# Patient Record
Sex: Female | Born: 1960 | Race: White | Hispanic: No | Marital: Married | Smoking: Current every day smoker
Health system: Southern US, Community
[De-identification: ages and names within clinical notes are randomized; demographics above are authoritative.]

---

## 1999-11-20 ENCOUNTER — Encounter: Payer: Self-pay | Admitting: Family Medicine

## 1999-11-20 ENCOUNTER — Encounter: Admission: RE | Admit: 1999-11-20 | Discharge: 1999-11-20 | Payer: Self-pay | Admitting: Family Medicine

## 2000-05-16 ENCOUNTER — Other Ambulatory Visit: Admission: RE | Admit: 2000-05-16 | Discharge: 2000-05-16 | Payer: Self-pay | Admitting: Family Medicine

## 2005-09-25 ENCOUNTER — Other Ambulatory Visit: Admission: RE | Admit: 2005-09-25 | Discharge: 2005-09-25 | Payer: Self-pay | Admitting: Obstetrics and Gynecology

## 2010-10-07 ENCOUNTER — Encounter: Payer: Self-pay | Admitting: Family Medicine

## 2013-07-12 ENCOUNTER — Encounter: Payer: Self-pay | Admitting: Gynecology

## 2013-07-12 ENCOUNTER — Ambulatory Visit (INDEPENDENT_AMBULATORY_CARE_PROVIDER_SITE_OTHER): Payer: BC Managed Care – PPO | Admitting: Gynecology

## 2013-07-12 ENCOUNTER — Other Ambulatory Visit (HOSPITAL_COMMUNITY)
Admission: RE | Admit: 2013-07-12 | Discharge: 2013-07-12 | Disposition: A | Discharge status: Home / Self Care | Payer: BC Managed Care – PPO | Source: Ambulatory Visit | Attending: Gynecology | Admitting: Gynecology

## 2013-07-12 VITALS — BP 138/88 | Ht 66.5 in | Wt 163.8 lb

## 2013-07-12 DIAGNOSIS — D259 Leiomyoma of uterus, unspecified: Secondary | ICD-10-CM

## 2013-07-12 DIAGNOSIS — Z23 Encounter for immunization: Secondary | ICD-10-CM

## 2013-07-12 DIAGNOSIS — Z1151 Encounter for screening for human papillomavirus (HPV): Secondary | ICD-10-CM | POA: Insufficient documentation

## 2013-07-12 DIAGNOSIS — F17200 Nicotine dependence, unspecified, uncomplicated: Secondary | ICD-10-CM

## 2013-07-12 DIAGNOSIS — Z01419 Encounter for gynecological examination (general) (routine) without abnormal findings: Secondary | ICD-10-CM

## 2013-07-12 DIAGNOSIS — Z1159 Encounter for screening for other viral diseases: Secondary | ICD-10-CM

## 2013-07-12 DIAGNOSIS — N951 Menopausal and female climacteric states: Secondary | ICD-10-CM

## 2013-07-12 LAB — CBC WITH DIFFERENTIAL/PLATELET
Basophils Absolute: 0 10*3/uL (ref 0.0–0.1)
Basophils Relative: 0 % (ref 0–1)
Eosinophils Absolute: 0 10*3/uL (ref 0.0–0.7)
Eosinophils Relative: 0 % (ref 0–5)
HCT: 44.6 % (ref 36.0–46.0)
Hemoglobin: 15.2 g/dL — ABNORMAL HIGH (ref 12.0–15.0)
Lymphocytes Relative: 24 % (ref 12–46)
Lymphs Abs: 2 10*3/uL (ref 0.7–4.0)
MCH: 29.7 pg (ref 26.0–34.0)
MCHC: 34.1 g/dL (ref 30.0–36.0)
MCV: 87.3 fL (ref 78.0–100.0)
Monocytes Absolute: 0.7 10*3/uL (ref 0.1–1.0)
Monocytes Relative: 9 % (ref 3–12)
Neutro Abs: 5.5 10*3/uL (ref 1.7–7.7)
Neutrophils Relative %: 67 % (ref 43–77)
Platelets: 354 10*3/uL (ref 150–400)
RBC: 5.11 MIL/uL (ref 3.87–5.11)
RDW: 13.5 % (ref 11.5–15.5)
WBC: 8.3 10*3/uL (ref 4.0–10.5)

## 2013-07-12 LAB — URINALYSIS W MICROSCOPIC + REFLEX CULTURE
Bacteria, UA: NONE SEEN
Bilirubin Urine: NEGATIVE
Casts: NONE SEEN
Crystals: NONE SEEN
Glucose, UA: NEGATIVE mg/dL
Hgb urine dipstick: NEGATIVE
Ketones, ur: NEGATIVE mg/dL
Leukocytes, UA: NEGATIVE
Nitrite: NEGATIVE
Protein, ur: NEGATIVE mg/dL
Specific Gravity, Urine: 1.005 (ref 1.005–1.030)
Squamous Epithelial / HPF: NONE SEEN
Urobilinogen, UA: 0.2 mg/dL (ref 0.0–1.0)
pH: 5.5 (ref 5.0–8.0)

## 2013-07-12 LAB — COMPREHENSIVE METABOLIC PANEL
ALT: 20 U/L (ref 0–35)
AST: 19 U/L (ref 0–37)
Albumin: 4.5 g/dL (ref 3.5–5.2)
Alkaline Phosphatase: 46 U/L (ref 39–117)
BUN: 12 mg/dL (ref 6–23)
CO2: 21 mEq/L (ref 19–32)
Calcium: 9.1 mg/dL (ref 8.4–10.5)
Chloride: 107 mEq/L (ref 96–112)
Creat: 0.79 mg/dL (ref 0.50–1.10)
Glucose, Bld: 107 mg/dL — ABNORMAL HIGH (ref 70–99)
Potassium: 4.4 mEq/L (ref 3.5–5.3)
Sodium: 138 mEq/L (ref 135–145)
Total Bilirubin: 0.4 mg/dL (ref 0.3–1.2)
Total Protein: 6.9 g/dL (ref 6.0–8.3)

## 2013-07-12 LAB — TSH: TSH: 0.795 u[IU]/mL (ref 0.350–4.500)

## 2013-07-12 LAB — CHOLESTEROL, TOTAL: Cholesterol: 152 mg/dL (ref 0–200)

## 2013-07-12 LAB — FOLLICLE STIMULATING HORMONE: FSH: 3 m[IU]/mL

## 2013-07-12 LAB — HEPATITIS C ANTIBODY: HCV Ab: NEGATIVE

## 2013-07-12 NOTE — Progress Notes (Signed)
Latoya Ryan 08-07-1961 [id number]   History:    52 y.o.  for annual gyn exam who is new to the practice. Patient stated that she has not had a gynecological exam since 2008. She had mentioned that back in 2006 she had some form of cervical dysplasia and cryotherapy. Patient smokes over a pack of cigarettes per day. Her last mammogram was in 2008 which she states was normal. Patient has not received previous colonoscopy. She reports normal menstrual cycles and is sexually active and not using any form of contraception. She has complained at times of hot flashes at night. She would like to have the flu vaccine today.  Past medical history,surgical history, family history and social history were all reviewed and documented in the EPIC chart.  Gynecologic History Patient's last menstrual period was 06/20/2013. Contraception: none Last Pap: 2008. Results were: normal Last mammogram: 2008. Results were: normal  Obstetric History OB History  No data available     ROS: A ROS was performed and pertinent positives and negatives are included in the history.  GENERAL: No fevers or chills. HEENT: No change in vision, no earache, sore throat or sinus congestion. NECK: No pain or stiffness. CARDIOVASCULAR: No chest pain or pressure. No palpitations. PULMONARY: No shortness of breath, cough or wheeze. GASTROINTESTINAL: No abdominal pain, nausea, vomiting or diarrhea, melena or bright red blood per rectum. GENITOURINARY: No urinary frequency, urgency, hesitancy or dysuria. MUSCULOSKELETAL: No joint or muscle pain, no back pain, no recent trauma. DERMATOLOGIC: No rash, no itching, no lesions. ENDOCRINE: No polyuria, polydipsia, no heat or cold intolerance. No recent change in weight. HEMATOLOGICAL: No anemia or easy bruising or bleeding. NEUROLOGIC: No headache, seizures, numbness, tingling or weakness. PSYCHIATRIC: No depression, no loss of interest in normal activity or change in sleep pattern.      Exam: chaperone present  BP 138/88  Ht 5' 6.5" (1.689 m)  Wt 163 lb 12.8 oz (74.299 kg)  BMI 26.04 kg/m2  LMP 06/20/2013  Body mass index is 26.04 kg/(m^2).  General appearance : Well developed well nourished female. No acute distress HEENT: Neck supple, trachea midline, no carotid bruits, no thyroidmegaly Lungs: Clear to auscultation, no rhonchi or wheezes, or rib retractions  Heart: Regular rate and rhythm, no murmurs or gallops Breast:Examined in sitting and supine position were symmetrical in appearance, no palpable masses or tenderness,  no skin retraction, no nipple inversion, no nipple discharge, no skin discoloration, no axillary or supraclavicular lymphadenopathy Abdomen: no palpable masses or tenderness, no rebound or guarding Extremities: no edema or skin discoloration or tenderness  Pelvic:  Bartholin, Urethra, Skene Glands: Within normal limits             Vagina: No gross lesions or discharge  Cervix: No gross lesions or discharge  Uterus irregularity noted right uterine sidewall fibroid? Adnexa right adnexal fullness? Cyst?  Anus and perineum  normal   Rectovaginal declined             Hemoccult declined rectal exam cords provided     Assessment/Plan:  52 y.o. female for annual exam With no prior gynecological exams as 2008. The patient received the flu vaccine today. She was provided with Hemoccult card system it to the office. She will be referred to a gastroenterologist for a screening colonoscopy. We discussed the detrimental effects of smoking. Patient tried Chantix many years ago cannot tolerate side effects. On exam today fullness was noted on the right adnexa along with a right sidewall uterine fibroid.  Patient will return to the office for followup ultrasound. The following labs were ordered today: CBC, screen cholesterol, comprehensive metabolic panel, urinalysis, TSH, FSH and a Pap smear was done today as well. She was given requisition to schedule a  mammogram as well. We discussed importance of calcium and vitamin D and regular exercise for osteoporosis prevention.  New CDC guidelines is recommending patients be tested once in her lifetime for hepatitis C antibody who were born between [birth year] through 1965. This was discussed with the patient today and has agreed to be tested today.  Literature and information was provided on the following: Flu vaccine, perimenopause, hormone replacement therapy, fibroids and smoking cessation  Note: This dictation was prepared with  Dragon/digital dictation along withSmart phrase technology. Any transcriptional errors that result from this process are unintentional.   Ok Edwards MD, 9:43 AM 07/12/2013

## 2013-07-12 NOTE — Patient Instructions (Addendum)
Hormone Therapy At menopause, your body begins making less estrogen and progesterone hormones. This causes the body to stop having menstrual periods. This is because estrogen and progesterone hormones control your periods and menstrual cycle. A lack of estrogen may cause symptoms such as:  Hot flushes (or hot flashes).  Vaginal dryness.  Dry skin.  Loss of sex drive.  Risk of bone loss (osteoporosis). When this happens, you may choose to take hormone therapy to get back the estrogen lost during menopause. When the hormone estrogen is given alone, it is usually referred to as ET (Estrogen Therapy). When the hormone progestin is combined with estrogen, it is generally called HT (Hormone Therapy). This was formerly known as hormone replacement therapy (HRT). Your caregiver can help you make a decision on what will be best for you. The decision to use HT seems to change often as new studies are done. Many studies do not agree on the benefits of hormone replacement therapy. LIKELY BENEFITS OF HT INCLUDE PROTECTION FROM:  Hot Flushes (also called hot flashes) - A hot flush is a sudden feeling of heat that spreads over the face and body. The skin may redden like a blush. It is connected with sweats and sleep disturbance. Women going through menopause may have hot flushes a few times a month or several times per day depending on the woman.  Osteoporosis (bone loss)- Estrogen helps guard against bone loss. After menopause, a woman's bones slowly lose calcium and become weak and brittle. As a result, bones are more likely to break. The hip, wrist, and spine are affected most often. Hormone therapy can help slow bone loss after menopause. Weight bearing exercise and taking calcium with vitamin D also can help prevent bone loss. There are also medications that your caregiver can prescribe that can help prevent osteoporosis.  Vaginal Dryness - Loss of estrogen causes changes in the vagina. Its lining may  become thin and dry. These changes can cause pain and bleeding during sexual intercourse. Dryness can also lead to infections. This can cause burning and itching. (Vaginal estrogen treatment can help relieve pain, itching, and dryness.)  Urinary Tract Infections are more common after menopause because of lack of estrogen. Some women also develop urinary incontinence because of low estrogen levels in the vagina and bladder.  Possible other benefits of estrogen include a positive effect on mood and short-term memory in women. RISKS AND COMPLICATIONS  Using estrogen alone without progesterone causes the lining of the uterus to grow. This increases the risk of lining of the uterus (endometrial) cancer. Your caregiver should give another hormone called progestin if you have a uterus.  Women who take combined (estrogen and progestin) HT appear to have an increased risk of breast cancer. The risk appears to be small, but increases throughout the time that HT is taken.  Combined therapy also makes the breast tissue slightly denser which makes it harder to read mammograms (breast X-rays).  Combined, estrogen and progesterone therapy can be taken together every day, in which case there may be spotting of blood. HT therapy can be taken cyclically in which case you will have menstrual periods. Cyclically means HT is taken for a set amount of days, then not taken, then this process is repeated.  HT may increase the risk of stroke, heart attack, breast cancer and forming blood clots in your leg.  Transdermal estrogen (estrogen that is absorbed through the skin with a patch or a cream) may have more positive results with:    Cholesterol.  Blood pressure.  Blood clots. Having the following conditions may indicate you should not have HT:  Endometrial cancer.  Liver disease.  Breast cancer.  Heart disease.  History of blood clots.  Stroke. TREATMENT   If you choose to take HT and have a uterus,  usually estrogen and progestin are prescribed.  Your caregiver will help you decide the best way to take the medications.  Possible ways to take estrogen include:  Pills.  Patches.  Gels.  Sprays.  Vaginal estrogen cream, rings and tablets.  It is best to take the lowest dose possible that will help your symptoms and take them for the shortest period of time that you can.  Hormone therapy can help relieve some of the problems (symptoms) that affect women at menopause. Before making a decision about HT, talk to your caregiver about what is best for you. Be well informed and comfortable with your decisions. HOME CARE INSTRUCTIONS   Follow your caregivers advice when taking the medications.  A Pap test is done to screen for cervical cancer.  The first Pap test should be done at age 74.  Between ages 39 and 31, Pap tests are repeated every 2 years.  Beginning at age 39, you are advised to have a Pap test every 3 years as long as your past 3 Pap tests have been normal.  Some women have medical problems that increase the chance of getting cervical cancer. Talk to your caregiver about these problems. It is especially important to talk to your caregiver if a new problem develops soon after your last Pap test. In these cases, your caregiver may recommend more frequent screening and Pap tests.  The above recommendations are the same for women who have or have not gotten the vaccine for HPV (Human Papillomavirus).  If you had a hysterectomy for a problem that was not a cancer or a condition that could lead to cancer, then you no longer need Pap tests. However, even if you no longer need a Pap test, a regular exam is a good idea to make sure no other problems are starting.   If you are between ages 13 and 31, and you have had normal Pap tests going back 10 years, you no longer need Pap tests. However, even if you no longer need a Pap test, a regular exam is a good idea to make sure no  other problems are starting.   If you have had past treatment for cervical cancer or a condition that could lead to cancer, you need Pap tests and screening for cancer for at least 20 years after your treatment.  If Pap tests have been discontinued, risk factors (such as a new sexual partner) need to be re-assessed to determine if screening should be resumed.  Some women may need screenings more often if they are at high risk for cervical cancer.  Get mammograms done as per the advice of your caregiver. SEEK IMMEDIATE MEDICAL CARE IF:  You develop abnormal vaginal bleeding.  You have pain or swelling in your legs, shortness of breath, or chest pain.  You develop dizziness or headaches.  You have lumps or changes in your breasts or armpits.  You have slurred speech.  You develop weakness or numbness of your arms or legs.  You have pain, burning, or bleeding when urinating.  You develop abdominal pain. Document Released: 06/01/2003 Document Revised: 11/25/2011 Document Reviewed: 09/19/2010 [facility] Patient Information 2014 [place], [place]. Perimenopause Perimenopause is the time when  your body begins to move into the menopause (no menstrual period for 12 straight months). It is a natural process. Perimenopause can begin 2 to 8 years before the menopause and usually lasts for one year after the menopause. During this time, your ovaries may or may not produce an egg. The ovaries vary in their production of estrogen and progesterone hormones each month. This can cause irregular menstrual periods, difficulty in getting pregnant, vaginal bleeding between periods and uncomfortable symptoms. CAUSES  Irregular production of the ovarian hormones, estrogen and progesterone, and not ovulating every month.  Other causes include:  Tumor of the pituitary gland in the brain.  Medical disease that affects the ovaries.  Radiation treatment.  Chemotherapy.  Unknown causes.  Heavy  smoking and excessive alcohol intake can bring on perimenopause sooner. SYMPTOMS   Hot flashes.  Night sweats.  Irregular menstrual periods.  Decrease sex drive.  Vaginal dryness.  Headaches.  Mood swings.  Depression.  Memory problems.  Irritability.  Tiredness.  Weight gain.  Trouble getting pregnant.  The beginning of losing bone cells (osteoporosis).  The beginning of hardening of the arteries (atherosclerosis). DIAGNOSIS  Your caregiver will make a diagnosis by analyzing your age, menstrual history and your symptoms. They will do a physical exam noting any changes in your body, especially your female organs. Female hormone tests may or may not be helpful depending on the amount and when you produce the female hormones. However, other hormone tests may be helpful (ex. thyroid hormone) to rule out other problems. TREATMENT  The decision to treat during the perimenopause should be made by you and your caregiver depending on how the symptoms are affecting you and your life style. There are various treatments available such as:  Treating individual symptoms with a specific medication for that symptom (ex. tranquilizer for depression).  Herbal medications that can help specific symptoms.  Counseling.  Group therapy.  No treatment. HOME CARE INSTRUCTIONS   Before seeing your caregiver, make a list of your menstrual periods (when the occur, how heavy they are, how long between periods and how long they last), your symptoms and when they started.  Take the medication as recommended by your caregiver.  Sleep and rest.  Exercise.  Eat a diet that contains calcium (good for your bones) and soy (acts like estrogen hormone).  Do not smoke.  Avoid alcoholic beverages.  Taking vitamin E may help in certain cases.  Take calcium and vitamin D supplements to help prevent bone loss.  Group therapy is sometimes helpful.  Acupuncture may help in some cases. SEEK  MEDICAL CARE IF:   You have any of the above and want to know if it is perimenopause.  You want advice and treatment for any of your symptoms mentioned above.  You need a referral to a specialist (gynecologist, psychiatrist or psychologist). SEEK IMMEDIATE MEDICAL CARE IF:   You have vaginal bleeding.  Your period lasts longer than 8 days.  You periods are recurring sooner than 21 days.  You have bleeding after intercourse.  You have severe depression.  You have pain when you urinate.  You have severe headaches.  You develop vision problems. Document Released: 10/10/2004 Document Revised: 11/25/2011 Document Reviewed: 06/30/2008 [facility] Patient Information 2014 [place], [place]. Tetanus, Diphtheria, Pertussis (Tdap) Vaccine What You Need to Know WHY GET VACCINATED? Tetanus, diphtheria and pertussis can be very serious diseases, even for adolescents and adults. Tdap vaccine can protect [place] from these diseases. TETANUS (Lockjaw) causes painful muscle tightening  and stiffness, usually all over the body.  It can lead to tightening of muscles in the head and neck so you can't open your mouth, swallow, or sometimes even breathe. Tetanus kills about 1 out of 5 people who are infected. DIPHTHERIA can cause a thick coating to form in the back of the throat.  It can lead to breathing problems, paralysis, heart failure, and death. PERTUSSIS (Whooping Cough) causes severe coughing spells, which can cause difficulty breathing, vomiting and disturbed sleep.  It can also lead to weight loss, incontinence, and rib fractures. Up to 2 in 100 adolescents and 5 in 100 adults with pertussis are hospitalized or have complications, which could include pneumonia and death. These diseases are caused by bacteria. Diphtheria and pertussis are spread from person to person through coughing or sneezing. Tetanus enters the body through cuts, scratches, or wounds. Before vaccines, the [place] saw as  many as 200,000 cases a year of diphtheria and pertussis, and hundreds of cases of tetanus. Since vaccination began, tetanus and diphtheria have dropped by about 99% and pertussis by about 80%. TDAP VACCINE Tdap vaccine can protect adolescents and adults from tetanus, diphtheria, and pertussis. One dose of Tdap is routinely given at age 25 or 31. People who did not get Tdap at that age should get it as soon as possible. Tdap is especially important for health care professionals and anyone having close contact with a baby younger than 12 months. Pregnant women should get a dose of Tdap during every pregnancy, to protect the newborn from pertussis. Infants are most at risk for severe, life-threatening complications from pertussis. A similar vaccine, called Td, protects from tetanus and diphtheria, but not pertussis. A Td booster should be given every 10 years. Tdap may be given as one of these boosters if you have not already gotten a dose. Tdap may also be given after a severe cut or burn to prevent tetanus infection. Your doctor can give you more information. Tdap may safely be given at the same time as other vaccines. SOME PEOPLE SHOULD NOT GET THIS VACCINE  If you ever had a life-threatening allergic reaction after a dose of any tetanus, diphtheria, or pertussis containing vaccine, OR if you have a severe allergy to any part of this vaccine, you should not get Tdap. Tell your doctor if you have any severe allergies.  If you had a coma, or long or multiple seizures within 7 days after a childhood dose of DTP or DTaP, you should not get Tdap, unless a cause other than the vaccine was found. You can still get Td.  Talk to your doctor if you:  have epilepsy or another nervous system problem,  had severe pain or swelling after any vaccine containing diphtheria, tetanus or pertussis,  ever had Guillain-Barr Syndrome (GBS),  aren't feeling well on the day the shot is scheduled. RISKS OF A VACCINE  REACTION With any medicine, including vaccines, there is a chance of side effects. These are usually mild and go away on their own, but serious reactions are also possible. Brief fainting spells can follow a vaccination, leading to injuries from falling. Sitting or lying down for about 15 minutes can help prevent these. Tell your doctor if you feel dizzy or light-headed, or have vision changes or ringing in the ears. Mild problems following Tdap (Did not interfere with activities)  Pain where the shot was given (about 3 in 4 adolescents or 2 in 3 adults)  Redness or swelling where the  shot was given (about 1 person in 5)  Mild fever of at least 100.31F (up to about 1 in 25 adolescents or 1 in 100 adults)  Headache (about 3 or 4 people in 10)  Tiredness (about 1 person in 3 or 4)  Nausea, vomiting, diarrhea, stomach ache (up to 1 in 4 adolescents or 1 in 10 adults)  Chills, body aches, sore joints, rash, swollen glands (uncommon) Moderate problems following Tdap (Interfered with activities, but did not require medical attention)  Pain where the shot was given (about 1 in 5 adolescents or 1 in 100 adults)  Redness or swelling where the shot was given (up to about 1 in 16 adolescents or 1 in 25 adults)  Fever over 102F (about 1 in 100 adolescents or 1 in 250 adults)  Headache (about 3 in 20 adolescents or 1 in 10 adults)  Nausea, vomiting, diarrhea, stomach ache (up to 1 or 3 people in 100)  Swelling of the entire arm where the shot was given (up to about 3 in 100). Severe problems following Tdap (Unable to perform usual activities, required medical attention)  Swelling, severe pain, bleeding and redness in the arm where the shot was given (rare). A severe allergic reaction could occur after any vaccine (estimated less than 1 in a million doses). WHAT IF THERE IS A SERIOUS REACTION? What should I look for?  Look for anything that concerns you, such as signs of a severe allergic  reaction, very high fever, or behavior changes. Signs of a severe allergic reaction can include hives, swelling of the face and throat, difficulty breathing, a fast heartbeat, dizziness, and weakness. These would start a few minutes to a few hours after the vaccination. What should I do?  If you think it is a severe allergic reaction or other emergency that can't wait, call 9-1-1 or get the person to the nearest hospital. Otherwise, call your doctor.  Afterward, the reaction should be reported to the "Vaccine Adverse Event Reporting System" (VAERS). Your doctor might file this report, or you can do it yourself through the VAERS web site at www.vaers.LAgents.no, or by calling 1-270 861 1211. VAERS is only for reporting reactions. They do not give medical advice.  THE NATIONAL VACCINE INJURY COMPENSATION PROGRAM The National Vaccine Injury Compensation Program (VICP) is a federal program that was created to compensate people who may have been injured by certain vaccines. Persons who believe they may have been injured by a vaccine can learn about the program and about filing a claim by calling 1-629 445 3804 or visiting the VICP website at SpiritualWord.at. HOW CAN I LEARN MORE?  Ask your doctor.  Call your local or state health department.  Contact the Centers for Disease Control and Prevention (CDC):  Call 815-416-2081 or visit CDC's website at PicCapture.uy. CDC Tdap Vaccine VIS (01/23/12) Document Released: 03/03/2012 Document Revised: 05/27/2012 Document Reviewed: 03/03/2012 [employer] Patient Information 2014 [place], [place]. Influenza Vaccine (Flu Vaccine, Inactivated) 2013 2014 What You Need to Know WHY GET VACCINATED?  Influenza ("flu") is a contagious disease that spreads around the [place] every winter, usually between October and May.  Flu is caused by the influenza virus, and can be spread by coughing, sneezing, and close contact.  Anyone can get flu,  but the risk of getting flu is highest among children. Symptoms come on suddenly and may last several days. They can include:  Fever or chills.  Sore throat.  Muscle aches.  Fatigue.  Cough.  Headache.  Runny or stuffy nose.  Flu can make some people much sicker than others. These people include young children, people 33 and older, pregnant women, and people with certain health conditions such as heart, lung or kidney disease, or a weakened immune system. Flu vaccine is especially important for these people, and anyone in close contact with them. Flu can also lead to pneumonia, and make existing medical conditions worse. It can cause diarrhea and seizures in children. Each year thousands of people in the [place] die from flu, and many more are hospitalized. Flu vaccine is the best protection we have from flu and its complications. Flu vaccine also helps prevent spreading flu from person to person. INACTIVATED FLU VACCINE There are 2 types of influenza vaccine:  You are getting an inactivated flu vaccine, which does not contain any live influenza virus. It is given by injection with a needle, and often called the "flu shot."  A different live, attenuated (weakened) influenza vaccine is sprayed into the nostrils. This vaccine is described in a separate Vaccine Information Statement. Flu vaccine is recommended every year. Children 6 months through 100 years of age should get 2 doses the first year they get vaccinated. Flu viruses are always changing. Each year's flu vaccine is made to protect from viruses that are most likely to cause disease that year. While flu vaccine cannot prevent all cases of flu, it is our best defense against the disease. Inactivated flu vaccine protects against 3 or 4 different influenza viruses. It takes about 2 weeks for protection to develop after the vaccination, and protection lasts several months to a year. Some illnesses that are not caused by influenza  virus are often mistaken for flu. Flu vaccine will not prevent these illnesses. It can only prevent influenza. A "high-dose" flu vaccine is available for people 39 years of age and older. The person giving you the vaccine can tell you more about it. Some inactivated flu vaccine contains a very small amount of a mercury-based preservative called thimerosal. Studies have shown that thimerosal in vaccines is not harmful, but flu vaccines that do not contain a preservative are available. SOME PEOPLE SHOULD NOT GET THIS VACCINE Tell the person who gives you the vaccine:  If you have any severe (life-threatening) allergies. If you ever had a life-threatening allergic reaction after a dose of flu vaccine, or have a severe allergy to any part of this vaccine, you may be advised not to get a dose. Most, but not all, types of flu vaccine contain a small amount of egg.  If you ever had Guillain Barr Syndrome (a severe paralyzing illness, also called GBS). Some people with a history of GBS should not get this vaccine. This should be discussed with your doctor.  If you are not feeling well. They might suggest waiting until you feel better. But you should come back. RISKS OF A VACCINE REACTION With a vaccine, like any medicine, there is a chance of side effects. These are usually mild and go away on their own. Serious side effects are also possible, but are very rare. Inactivated flu vaccine does not contain live flu virus, sogetting flu from this vaccine is not possible. Brief fainting spells and related symptoms (such as jerking movements) can happen after any medical procedure, including vaccination. Sitting or lying down for about 15 minutes after a vaccination can help prevent fainting and injuries caused by falls. Tell your doctor if you feel dizzy or lightheaded, or have vision changes or ringing in the ears. Mild problems  following inactivated flu vaccine:  Soreness, redness, or swelling where the shot  was given.  Hoarseness; sore, red or itchy eyes; or cough.  Fever.  Aches.  Headache.  Itching.  Fatigue. If these problems occur, they usually begin soon after the shot and last 1 or 2 days. Moderate problems following inactivated flu vaccine:  Young children who get inactivated flu vaccine and pneumococcal vaccine (PCV13) at the same time may be at increased risk for seizures caused by fever. Ask your doctor for more information. Tell your doctor if a child who is getting flu vaccine has ever had a seizure. Severe problems following inactivated flu vaccine:  A severe allergic reaction could occur after any vaccine (estimated less than 1 in a million doses).  There is a small possibility that inactivated flu vaccine could be associated with Guillan Barr Syndrome (GBS), no more than 1 or 2 cases per million people vaccinated. This is much lower than the risk of severe complications from flu, which can be prevented by flu vaccine. The safety of vaccines is always being monitored. For more information, visit: http://floyd.org/ WHAT IF THERE IS A SERIOUS REACTION? What should I look for?  Look for anything that concerns you, such as signs of a severe allergic reaction, very high fever, or behavior changes. Signs of a severe allergic reaction can include hives, swelling of the face and throat, difficulty breathing, a fast heartbeat, dizziness, and weakness. These would start a few minutes to a few hours after the vaccination. What should I do?  If you think it is a severe allergic reaction or other emergency that cannot wait, call [phone] 1 1  or get the person to the nearest hospital. Otherwise, call your doctor.  Afterward, the reaction should be reported to the Vaccine Adverse Event Reporting System (VAERS). Your doctor might file this report, or you can do it yourself through the VAERS website at www.vaers.LAgents.no, or by calling 1-631-273-8623. VAERS is only for reporting  reactions. They do not give medical advice. THE NATIONAL VACCINE INJURY COMPENSATION PROGRAM The National Vaccine Injury Compensation Program (VICP) is a federal program that was created to compensate people who may have been injured by certain vaccines. Persons who believe they may have been injured by a vaccine can learn about the program and about filing a claim by calling 1-318 832 6659 or visiting the VICP website at SpiritualWord.at HOW CAN I LEARN MORE?  Ask your doctor.  Call your local or state health department.  Contact the Centers for Disease Control and Prevention (CDC):  Call 912-192-1836 (1-800-CDC-INFO) or  Visit CDC's website at BiotechRoom.com.cy CDC Inactivated Influenza Vaccine Interim VIS (04/10/12) Document Released: 06/27/2006 Document Revised: 05/27/2012 Document Reviewed: 04/10/2012 [facility] Patient Information 2014 [place], [place]. Smoking Cessation Quitting smoking is important to your health and has many advantages. However, it is not always easy to quit since nicotine is a very addictive drug. Often times, people try 3 times or more before being able to quit. This document explains the best ways for you to prepare to quit smoking. Quitting takes hard work and a lot of effort, but you can do it. ADVANTAGES OF QUITTING SMOKING  You will live longer, feel better, and live better.  Your body will feel the impact of quitting smoking almost immediately.  Within 20 minutes, blood pressure decreases. Your pulse returns to its normal level.  After 8 hours, carbon monoxide levels in the blood return to normal. Your oxygen level increases.  After 24 hours, the chance of having  a heart attack starts to decrease. Your breath, hair, and body stop smelling like smoke.  After 48 hours, damaged nerve endings begin to recover. Your sense of taste and smell improve.  After 72 hours, the body is virtually free of nicotine. Your bronchial tubes relax and  breathing becomes easier.  After 2 to 12 weeks, lungs can hold more air. Exercise becomes easier and circulation improves.  The risk of having a heart attack, stroke, cancer, or lung disease is greatly reduced.  After 1 year, the risk of coronary heart disease is cut in half.  After 5 years, the risk of stroke falls to the same as a nonsmoker.  After 10 years, the risk of lung cancer is cut in half and the risk of other cancers decreases significantly.  After 15 years, the risk of coronary heart disease drops, usually to the level of a nonsmoker.  If you are pregnant, quitting smoking will improve your chances of having a healthy baby.  The people you live with, especially any children, will be healthier.  You will have extra money to spend on things other than cigarettes. QUESTIONS TO THINK ABOUT BEFORE ATTEMPTING TO QUIT You may want to talk about your answers with your caregiver.  Why do you want to quit?  If you tried to quit in the past, what helped and what did not?  What will be the most difficult situations for you after you quit? How will you plan to handle them?  Who can help you through the tough times? Your family? Friends? A caregiver?  What pleasures do you get from smoking? What ways can you still get pleasure if you quit? Here are some questions to ask your caregiver:  How can you help me to be successful at quitting?  What medicine do you think would be best for me and how should I take it?  What should I do if I need more help?  What is smoking withdrawal like? How can I get information on withdrawal? GET READY  Set a quit date.  Change your environment by getting rid of all cigarettes, ashtrays, matches, and lighters in your home, car, or work. Do not let people smoke in your home.  Review your past attempts to quit. Think about what worked and what did not. GET SUPPORT AND ENCOURAGEMENT You have a better chance of being successful if you have help.  You can get support in many ways.  Tell your family, friends, and co-workers that you are going to quit and need their support. Ask them not to smoke around you.  Get individual, group, or telephone counseling and support. Programs are available at [employer] and health centers. Call your local health department for information about programs in your area.  Spiritual beliefs and practices may help some smokers quit.  Download a "quit meter" on your computer to keep track of quit statistics, such as how long you have gone without smoking, cigarettes not smoked, and money saved.  Get a self-help book about quitting smoking and staying off of tobacco. LEARN NEW SKILLS AND BEHAVIORS  Distract yourself from urges to smoke. Talk to someone, go for a walk, or occupy your time with a task.  Change your normal routine. Take a different route to work. Drink tea instead of coffee. Eat breakfast in a different place.  Reduce your stress. Take a hot bath, exercise, or read a book.  Plan something enjoyable to do every day. Reward yourself for not smoking.  Explore interactive web-based programs that specialize in helping you quit. GET MEDICINE AND USE IT CORRECTLY Medicines can help you stop smoking and decrease the urge to smoke. Combining medicine with the above behavioral methods and support can greatly increase your chances of successfully quitting smoking.  Nicotine replacement therapy helps deliver nicotine to your body without the negative effects and risks of smoking. Nicotine replacement therapy includes nicotine gum, lozenges, inhalers, nasal sprays, and skin patches. Some may be available over-the-counter and others require a prescription.  Antidepressant medicine helps people abstain from smoking, but how this works is unknown. This medicine is available by prescription.  Nicotinic receptor partial agonist medicine simulates the effect of nicotine in your brain. This medicine is  available by prescription. Ask your caregiver for advice about which medicines to use and how to use them based on your health history. Your caregiver will tell you what side effects to look out for if you choose to be on a medicine or therapy. Carefully read the information on the package. Do not use any other product containing nicotine while using a nicotine replacement product.  RELAPSE OR DIFFICULT SITUATIONS Most relapses occur within the first 3 months after quitting. Do not be discouraged if you start smoking again. Remember, most people try several times before finally quitting. You may have symptoms of withdrawal because your body is used to nicotine. You may crave cigarettes, be irritable, feel very hungry, cough often, get headaches, or have difficulty concentrating. The withdrawal symptoms are only temporary. They are strongest when you first quit, but they will go away within 10 14 days. To reduce the chances of relapse, try to:  Avoid drinking alcohol. Drinking lowers your chances of successfully quitting.  Reduce the amount of caffeine you consume. Once you quit smoking, the amount of caffeine in your body increases and can give you symptoms, such as a rapid heartbeat, sweating, and anxiety.  Avoid smokers because they can make you want to smoke.  Do not let weight gain distract you. Many smokers will gain weight when they quit, usually less than 10 pounds. Eat a healthy diet and stay active. You can always lose the weight gained after you quit.  Find ways to improve your mood other than smoking. FOR MORE INFORMATION  www.smokefree.gov  Document Released: 08/27/2001 Document Revised: 03/03/2012 Document Reviewed: 12/12/2011 [facility] Patient Information 2014 [place], [place]. Fibroids Fibroids are lumps (tumors) that can occur any place in a woman's body. These lumps are not cancerous. Fibroids vary in size, weight, and where they grow. HOME CARE  Do not take aspirin.  Write  down the number of pads or tampons you use during your period. Tell your doctor. This can help determine the best treatment for you. GET HELP RIGHT AWAY IF:  You have pain in your lower belly (abdomen) that is not helped with medicine.  You have cramps that are not helped with medicine.  You have more bleeding between or during your period.  You feel lightheaded or pass out (faint).  Your lower belly pain gets worse. MAKE SURE YOU:  Understand these instructions.  Will watch your condition.  Will get help right away if you are not doing well or get worse. Document Released: 10/05/2010 Document Revised: 11/25/2011 Document Reviewed: 10/05/2010 [facility] Patient Information 2014 [place], [place].

## 2013-07-28 ENCOUNTER — Other Ambulatory Visit: Payer: BC Managed Care – PPO

## 2013-07-28 ENCOUNTER — Ambulatory Visit: Payer: BC Managed Care – PPO | Admitting: Gynecology

## 2013-08-06 ENCOUNTER — Ambulatory Visit (INDEPENDENT_AMBULATORY_CARE_PROVIDER_SITE_OTHER): Payer: BC Managed Care – PPO

## 2013-08-06 ENCOUNTER — Ambulatory Visit (INDEPENDENT_AMBULATORY_CARE_PROVIDER_SITE_OTHER): Payer: BC Managed Care – PPO | Admitting: Gynecology

## 2013-08-06 DIAGNOSIS — D259 Leiomyoma of uterus, unspecified: Secondary | ICD-10-CM

## 2013-08-06 DIAGNOSIS — R1900 Intra-abdominal and pelvic swelling, mass and lump, unspecified site: Secondary | ICD-10-CM

## 2013-08-06 MED ORDER — MEDROXYPROGESTERONE ACETATE 150 MG/ML IM SUSP
150.0000 mg | Freq: Once | INTRAMUSCULAR | Status: AC
  Administered 2013-08-06: 150 mg via INTRAMUSCULAR

## 2013-08-06 NOTE — Progress Notes (Signed)
Patient presented to the office today to discuss her ultrasound. The scan was ordered as a result of findings during pelvic exam on her annual visit on 07/12/2013 which she was seen in the office as a new patient. Patient stated that she has not had a gynecological exam since 2008. She had mentioned that back in 2006 she had some form of cervical dysplasia and cryotherapy. Patient smokes over a pack of cigarettes per day.  During her office visit 07/12/2013 her uterus felt irregular especially the right uterine fundal sidewall. Ultrasound today demonstrated the following:  Uterus measured 9.4 x 7.0 x 4.6 cm with endometrial stripe of 8.3 mm. Patient had 5 fibroids the largest one measuring 37 x 35 mm. On the right ovary and echo-free cysts thickwalled vascular measuring 23 x 18 x 17 mm consistent with corpus luteum cyst. The right adnexal echo-free thinwall avascular seeded mass was noted measuring 34 x 22 mm. 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  Assessment/plan: Perimenopausal patient at 52 years of age with incidental finding on long-overdue gynecological exam consisting of a large left ovarian mass 6 cm in diameter. We had a discussion on the different types of cyst as well as consistency and those are suspicious for malignancy. She also had the right ovary echo free thick  walled vascular cyst measuring 23 x 18 x 17 mm as well as the additional one echo free thin-walled avascular which measured 34 x 22 mm which could be functional.She is going to receive a shot of Depo-Provera 150 mg IM today  and I explained her the limitations on whether this may help shrink the cysts or not. We're also going to check an Ova-1 ovarian cancer blood screen and manage accordingly. If this does come back normal  We are going to repeat the ultrasound in 2 months but if it's elevated and suspicious for malignancy we'll need to discuss alternative options such as possible GYN  oncology referral for possible staging the event of cancer. Literature information was provided all questions were answered.

## 2013-08-06 NOTE — Patient Instructions (Signed)
Ovarian Cyst  The ovaries are small organs that are on each side of the uterus. The ovaries are the organs that produce the female hormones, estrogen and progesterone. An ovarian cyst is a sac filled with fluid that can vary in its size. It is normal for a small cyst to form in women who are in the childbearing age and who have menstrual periods. This type of cyst is called a follicle cyst that becomes an ovulation cyst (corpus luteum cyst) after it produces the women's egg. It later goes away on its own if the woman does not become pregnant. There are other kinds of ovarian cysts that may cause problems and may need to be treated. The most serious problem is a cyst with cancer. It should be noted that menopausal women who have an ovarian cyst are at a higher risk of it being a cancer cyst. They should be evaluated very quickly, thoroughly and followed closely. This is especially true in menopausal women because of the high rate of ovarian cancer in women in menopause.  CAUSES AND TYPES OF OVARIAN CYSTS:   FUNCTIONAL CYST: The follicle/corpus luteum cyst is a functional cyst that occurs every month during ovulation with the menstrual cycle. They go away with the next menstrual cycle if the woman does not get pregnant. Usually, there are no symptoms with a functional cyst.   ENDOMETRIOMA CYST: This cyst develops from the lining of the uterus tissue. This cyst gets in or on the ovary. It grows every month from the bleeding during the menstrual period. It is also called a "chocolate cyst" because it becomes filled with blood that turns brown. This cyst can cause pain in the lower abdomen during intercourse and with your menstrual period.   CYSTADENOMA CYST: This cyst develops from the cells on the outside of the ovary. They usually are not cancerous. They can get very big and cause lower abdomen pain and pain with intercourse. This type of cyst can twist on itself, cut off its blood supply and cause severe pain. It  also can easily rupture and cause a lot of pain.   DERMOID CYST: This type of cyst is sometimes found in both ovaries. They are found to have different kinds of body tissue in the cyst. The tissue includes skin, teeth, hair, and/or cartilage. They usually do not have symptoms unless they get very big. Dermoid cysts are rarely cancerous.   POLYCYSTIC OVARY: This is a rare condition with hormone problems that produces many small cysts on both ovaries. The cysts are follicle-like cysts that never produce an egg and become a corpus luteum. It can cause an increase in body weight, infertility, acne, increase in body and facial hair and lack of menstrual periods or rare menstrual periods. Many women with this problem develop type 2 diabetes. The exact cause of this problem is unknown. A polycystic ovary is rarely cancerous.   THECA LUTEIN CYST: Occurs when too much hormone (human chorionic gonadotropin) is produced and over-stimulates the ovaries to produce an egg. They are frequently seen when doctors stimulate the ovaries for invitro-fertilization (test tube babies).   LUTEOMA CYST: This cyst is seen during pregnancy. Rarely it can cause an obstruction to the birth canal during labor and delivery. They usually go away after delivery.  SYMPTOMS    Pelvic pain or pressure.   Pain during sexual intercourse.   Increasing girth (swelling) of the abdomen.   Abnormal menstrual periods.   Increasing pain with menstrual periods.     You stop having menstrual periods and you are not pregnant.  DIAGNOSIS   The diagnosis can be made during:   Routine or annual pelvic examination (common).   Ultrasound.   X-ray of the pelvis.   CT Scan.   MRI.   Blood tests.  TREATMENT    Treatment may only be to follow the cyst monthly for 2 to 3 months with your caregiver. Many go away on their own, especially functional cysts.   May be aspirated (drained) with a long needle with ultrasound, or by laparoscopy (inserting a tube into  the pelvis through a small incision).   The whole cyst can be removed by laparoscopy.   Sometimes the cyst may need to be removed through an incision in the lower abdomen.   Hormone treatment is sometimes used to help dissolve certain cysts.   Birth control pills are sometimes used to help dissolve certain cysts.  HOME CARE INSTRUCTIONS   Follow your caregiver's advice regarding:   Medicine.   Follow up visits to evaluate and treat the cyst.   You may need to come back or make an appointment with another caregiver, to find the exact cause of your cyst, if your caregiver is not a gynecologist.   Get your yearly and recommended pelvic examinations and Pap tests.   Let your caregiver know if you have had an ovarian cyst in the past.  SEEK MEDICAL CARE IF:    Your periods are late, irregular, they stop, or are painful.   Your stomach (abdomen) or pelvic pain does not go away.   Your stomach becomes larger or swollen.   You have pressure on your bladder or trouble emptying your bladder completely.   You have painful sexual intercourse.   You have feelings of fullness, pressure, or discomfort in your stomach.   You lose weight for no apparent reason.   You feel generally ill.   You become constipated.   You lose your appetite.   You develop acne.   You have an increase in body and facial hair.   You are gaining weight, without changing your exercise and eating habits.   You think you are pregnant.  SEEK IMMEDIATE MEDICAL CARE IF:    You have increasing abdominal pain.   You feel sick to your stomach (nausea) and/or vomit.   You develop a fever that comes on suddenly.   You develop abdominal pain during a bowel movement.   Your menstrual periods become heavier than usual.  Document Released: 09/02/2005 Document Revised: 11/25/2011 Document Reviewed: 07/06/2009  [employer] Patient Information 2014 [employer].

## 2013-10-01 ENCOUNTER — Ambulatory Visit: Payer: BC Managed Care – PPO | Admitting: Gynecology

## 2013-10-01 ENCOUNTER — Other Ambulatory Visit: Payer: BC Managed Care – PPO

## 2013-10-04 ENCOUNTER — Other Ambulatory Visit: Payer: Self-pay | Admitting: Gynecology

## 2013-10-04 ENCOUNTER — Ambulatory Visit (INDEPENDENT_AMBULATORY_CARE_PROVIDER_SITE_OTHER): Payer: BC Managed Care – PPO

## 2013-10-04 ENCOUNTER — Ambulatory Visit (INDEPENDENT_AMBULATORY_CARE_PROVIDER_SITE_OTHER): Payer: BC Managed Care – PPO | Admitting: Gynecology

## 2013-10-04 VITALS — BP 144/80

## 2013-10-04 DIAGNOSIS — N831 Corpus luteum cyst of ovary, unspecified side: Secondary | ICD-10-CM

## 2013-10-04 DIAGNOSIS — R9389 Abnormal findings on diagnostic imaging of other specified body structures: Secondary | ICD-10-CM

## 2013-10-04 DIAGNOSIS — D259 Leiomyoma of uterus, unspecified: Secondary | ICD-10-CM

## 2013-10-04 DIAGNOSIS — D251 Intramural leiomyoma of uterus: Secondary | ICD-10-CM

## 2013-10-04 DIAGNOSIS — R1900 Intra-abdominal and pelvic swelling, mass and lump, unspecified site: Secondary | ICD-10-CM

## 2013-10-04 DIAGNOSIS — N951 Menopausal and female climacteric states: Secondary | ICD-10-CM

## 2013-10-04 DIAGNOSIS — D252 Subserosal leiomyoma of uterus: Secondary | ICD-10-CM

## 2013-10-04 DIAGNOSIS — N839 Noninflammatory disorder of ovary, fallopian tube and broad ligament, unspecified: Secondary | ICD-10-CM

## 2013-10-04 DIAGNOSIS — N9489 Other specified conditions associated with female genital organs and menstrual cycle: Secondary | ICD-10-CM

## 2013-10-04 DIAGNOSIS — N838 Other noninflammatory disorders of ovary, fallopian tube and broad ligament: Secondary | ICD-10-CM

## 2013-10-04 NOTE — Progress Notes (Signed)
   Patient is a 53 year old gravida 0 who presented to be office today for followup ultrasound on previously seen ovarian cyst. She was last seen in the office 08/06/2013 for the first ultrasound as a result of incidental finding during routine gynecological examination on 07/12/2013. Patient and not seen a gynecologist in over 7 years.She had mentioned that back in 2006 she had some form of cervical dysplasia and cryotherapy. Patient smokes over a pack of cigarettes per day.  The ultrasound 08/06/2013 as follows: Uterus measured 9.4 x 7.0 x 4.6 cm with endometrial stripe of 8.3 mm. Patient had 5 fibroids the largest one measuring 37 x 35 mm. On the right ovary and echo-free cysts thickwalled vascular measuring 23 x 18 x 17 mm consistent with corpus luteum cyst. The right adnexal echo-free thinwall avascular seeded mass was noted measuring 34 x 22 mm. 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  An Ova1 ovarian cancer blood screen test was ordered but it appears to not have been run by the lab?  She was asked to return for the followup ultrasound today to see this any significant change after having received Depo-Provera 150 mg IM.  Ultrasound today: Uterus unchanged. Multiple fibroids are seen before. Prominent endometrial cavity with positive vascular flow. Right ovary normal with follicles, serpentine tubular mass, with internal low level echoes avascular measuring 16.8 x 5.2 x 5.5 cm, left ovary thinwall avascular cyst measuring 8.8 x 1.0 x 6.9 cm diffuse homogeneous low level internal low level echoes larger than previous scan.  The patient is still menstruating and has very little if any vasomotor symptoms. On 07/12/2013 she had a normal conference and metabolic panel as well as CBC, TSH and FSH. She had a negative hepatitis C. Urinalysis was negative. Pap smear was normal.  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 Also a large hydrosalpinx on the right. Ova1 was ordered today. We will schedule for patient to undergo total abdominal hysterectomy with bilateral salpingo-oophorectomy and we'll see her the week before her surgery for preoperative examination and consultation as well as to plan an endometrial biopsy as well. Literature information was provided. If the Ova1 comes back with highly suspicious for malignancy I have explained to the patient that we will need to refer her to the GYN oncologist.

## 2013-10-04 NOTE — Patient Instructions (Signed)
Hysterectomy Information  A hysterectomy is a surgery in which your uterus is removed. This surgery may be done to treat various medical problems. After the surgery, you will no longer have menstrual periods. The surgery will also make you unable to become pregnant (sterile). The fallopian tubes and ovaries can be removed (bilateral salpingo-oophorectomy) during this surgery as well.  REASONS FOR A HYSTERECTOMY  Persistent, abnormal bleeding.  Lasting (chronic) pelvic pain or infection.  The lining of the uterus (endometrium) starts growing outside the uterus (endometriosis).  The endometrium starts growing in the muscle of the uterus (adenomyosis).  The uterus falls down into the vagina (pelvic organ prolapse).  Noncancerous growths in the uterus (uterine fibroids) that cause symptoms.  Precancerous cells.  Cervical cancer or uterine cancer. TYPES OF HYSTERECTOMIES  Supracervical hysterectomy In this type, the top part of the uterus is removed, but not the cervix.  Total hysterectomy The uterus and cervix are removed.  Radical hysterectomy The uterus, the cervix, and the fibrous tissue that holds the uterus in place in the pelvis (parametrium) are removed. WAYS A HYSTERECTOMY CAN BE PERFORMED  Abdominal hysterectomy A large surgical cut (incision) is made in the abdomen. The uterus is removed through this incision.  Vaginal hysterectomy An incision is made in the vagina. The uterus is removed through this incision. There are no abdominal incisions.  Conventional laparoscopic hysterectomy Three or four small incisions are made in the abdomen. A thin, lighted tube with a camera (laparoscope) is inserted into one of the incisions. Other tools are put through the other incisions. The uterus is cut into small pieces. The small pieces are removed through the incisions, or they are removed through the vagina.  Laparoscopically assisted vaginal hysterectomy (LAVH) Three or four small  incisions are made in the abdomen. Part of the surgery is performed laparoscopically and part vaginally. The uterus is removed through the vagina.  Robot-assisted laparoscopic hysterectomy A laparoscope and other tools are inserted into 3 or 4 small incisions in the abdomen. A computer-controlled device is used to give the surgeon a 3D image and to help control the surgical instruments. This allows for more precise movements of surgical instruments. The uterus is cut into small pieces and removed through the incisions or removed through the vagina. RISKS AND COMPLICATIONS  Possible complications associated with this procedure include:  Bleeding and risk of blood transfusion. Tell your health care provider if you do not want to receive any blood products.  Blood clots in the legs or lung.  Infection.  Injury to surrounding organs.  Problems or side effects related to anesthesia.  Conversion to an abdominal hysterectomy from one of the other techniques. WHAT TO EXPECT AFTER A HYSTERECTOMY  You will be given pain medicine.  You will need to have someone with you for the first 3 5 days after you go home.  You will need to follow up with your surgeon in 2 4 weeks after surgery to evaluate your progress.  You may have early menopause symptoms such as hot flashes, night sweats, and insomnia.  If you had a hysterectomy for a problem that was not cancer or not a condition that could lead to cancer, then you no longer need Pap tests. However, even if you no longer need a Pap test, a regular exam is a good idea to make sure no other problems are starting. Document Released: 02/26/2001 Document Revised: 06/23/2013 Document Reviewed: 05/10/2013 [facility] Patient Information 2014 [place]. Fibroids Fibroids are lumps (tumors)  that can occur any place in a woman's body. These lumps are not cancerous. Fibroids vary in size, weight, and where they grow. HOME CARE  Do not take aspirin.  Write  down the number of pads or tampons you use during your period. Tell your doctor. This can help determine the best treatment for you. GET HELP RIGHT AWAY IF:  You have pain in your lower belly (abdomen) that is not helped with medicine.  You have cramps that are not helped with medicine.  You have more bleeding between or during your period.  You feel lightheaded or pass out (faint).  Your lower belly pain gets worse. MAKE SURE YOU:  Understand these instructions.  Will watch your condition.  Will get help right away if you are not doing well or get worse. Document Released: 10/05/2010 Document Revised: 11/25/2011 Document Reviewed: 10/05/2010 [facility] Patient Information 2014 [place], [place]. Ovarian Cyst An ovarian cyst is a fluid-filled sac that forms on an ovary. The ovaries are small organs that produce eggs in women. Various types of cysts can form on the ovaries. Most are not cancerous. Many do not cause problems, and they often go away on their own. Some may cause symptoms and require treatment. Common types of ovarian cysts include:  Functional cysts These cysts may occur every month during the menstrual cycle. This is normal. The cysts usually go away with the next menstrual cycle if the woman does not get pregnant. Usually, there are no symptoms with a functional cyst.  Endometrioma cysts These cysts form from the tissue that lines the uterus. They are also called "chocolate cysts" because they become filled with blood that turns brown. This type of cyst can cause pain in the lower abdomen during intercourse and with your menstrual period.  Cystadenoma cysts This type develops from the cells on the outside of the ovary. These cysts can get very big and cause lower abdomen pain and pain with intercourse. This type of cyst can twist on itself, cut off its blood supply, and cause severe pain. It can also easily rupture and cause a lot of pain.  Dermoid cysts This type of cyst is  sometimes found in both ovaries. These cysts may contain different kinds of body tissue, such as skin, teeth, hair, or cartilage. They usually do not cause symptoms unless they get very big.  Theca lutein cysts These cysts occur when too much of a certain hormone (human chorionic gonadotropin) is produced and overstimulates the ovaries to produce an egg. This is most common after procedures used to assist with the conception of a baby (in vitro fertilization). CAUSES   Fertility drugs can cause a condition in which multiple large cysts are formed on the ovaries. This is called ovarian hyperstimulation syndrome.  A condition called polycystic ovary syndrome can cause hormonal imbalances that can lead to nonfunctional ovarian cysts. SIGNS AND SYMPTOMS  Many ovarian cysts do not cause symptoms. If symptoms are present, they may include:  Pelvic pain or pressure.  Pain in the lower abdomen.  Pain during sexual intercourse.  Increasing girth (swelling) of the abdomen.  Abnormal menstrual periods.  Increasing pain with menstrual periods.  Stopping having menstrual periods without being pregnant. DIAGNOSIS  These cysts are commonly found during a routine or annual pelvic exam. Tests may be ordered to find out more about the cyst. These tests may include:  Ultrasound.  X-ray of the pelvis.  CT scan.  MRI.  Blood tests. TREATMENT  Many ovarian cysts  go away on their own without treatment. Your health care provider may want to check your cyst regularly for 2 3 months to see if it changes. For women in menopause, it is particularly important to monitor a cyst closely because of the higher rate of ovarian cancer in menopausal women. When treatment is needed, it may include any of the following:  A procedure to drain the cyst (aspiration). This may be done using a long needle and ultrasound. It can also be done through a laparoscopic procedure. This involves using a thin, lighted tube with  a tiny camera on the end (laparoscope) inserted through a small incision.  Surgery to remove the whole cyst. This may be done using laparoscopic surgery or an open surgery involving a larger incision in the lower abdomen.  Hormone treatment or birth control pills. These methods are sometimes used to help dissolve a cyst. HOME CARE INSTRUCTIONS   Only take over-the-counter or prescription medicines as directed by your health care provider.  Follow up with your health care provider as directed.  Get regular pelvic exams and Pap tests. SEEK MEDICAL CARE IF:   Your periods are late, irregular, or painful, or they stop.  Your pelvic pain or abdominal pain does not go away.  Your abdomen becomes larger or swollen.  You have pressure on your bladder or trouble emptying your bladder completely.  You have pain during sexual intercourse.  You have feelings of fullness, pressure, or discomfort in your stomach.  You lose weight for no apparent reason.  You feel generally ill.  You become constipated.  You lose your appetite.  You develop acne.  You have an increase in body and facial hair.  You are gaining weight, without changing your exercise and eating habits.  You think you are pregnant. SEEK IMMEDIATE MEDICAL CARE IF:   You have increasing abdominal pain.  You feel sick to your stomach (nauseous), and you throw up (vomit).  You develop a fever that comes on suddenly.  You have abdominal pain during a bowel movement.  Your menstrual periods become heavier than usual. Document Released: 09/02/2005 Document Revised: 06/23/2013 Document Reviewed: 05/10/2013 [facility] Patient Information 2014 [place].

## 2013-10-06 ENCOUNTER — Encounter (HOSPITAL_COMMUNITY): Payer: Self-pay | Admitting: Pharmacist

## 2013-10-08 ENCOUNTER — Telehealth: Payer: Self-pay | Admitting: Gynecology

## 2013-10-08 NOTE — Telephone Encounter (Signed)
Contacted patient this afternoon to discuss her recent ovarian malignancy risk-ROMA blood tests analysis that was ordered on January 19 secondary to suspicious left adnexal mass. I explained her the results came back highly suspicious for malignancy and that she would benefit from GYN oncology consultation which will be scheduled next week. We're also going to schedule a chest x-ray PA and lateral along with a CT of the abdomen and pelvis.

## 2013-10-11 ENCOUNTER — Telehealth: Payer: Self-pay | Admitting: *Deleted

## 2013-10-11 DIAGNOSIS — R1900 Intra-abdominal and pelvic swelling, mass and lump, unspecified site: Secondary | ICD-10-CM

## 2013-10-11 DIAGNOSIS — R978 Other abnormal tumor markers: Secondary | ICD-10-CM

## 2013-10-11 NOTE — Telephone Encounter (Signed)
Lab results faxed to cancer center as well for ovarian malignancy risk

## 2013-10-11 NOTE — Telephone Encounter (Signed)
Appointment 10/20/13 @ 1:15 pm with Dr.Gehrig, order placed for chest x-ray. [facility]

## 2013-10-11 NOTE — Telephone Encounter (Signed)
Message copied by Thamas Jaegers on Mon Oct 11, 2013 12:37 PM ------      Message from: Ramond Craver      Created: Fri Oct 08, 2013  5:18 PM       Anderson Malta,      This is the patient I was waiting on labs on at the end of the day on Friday.  Unfortunately, all her tumor marker tests were high so Dr. Moshe Salisbury will not be doing her surgery. I will cancel that on Monday.            He wants you to schedule her an appointment with GYN Oncology. He also wants her to have a Chest X-Ray PA and Lateral.              He was trying to call her when I left on Friday afternoon to let her know. Maybe don't call her until we know that he reached her with the news. Thanks              ------

## 2013-10-12 ENCOUNTER — Institutional Professional Consult (permissible substitution): Payer: BC Managed Care – PPO | Admitting: Gynecology

## 2013-10-13 ENCOUNTER — Inpatient Hospital Stay (HOSPITAL_COMMUNITY): Admission: RE | Admit: 2013-10-13 | Payer: BC Managed Care – PPO | Source: Ambulatory Visit

## 2013-10-14 ENCOUNTER — Ambulatory Visit (HOSPITAL_COMMUNITY)
Admission: RE | Admit: 2013-10-14 | Discharge: 2013-10-14 | Disposition: A | Discharge status: Home / Self Care | Payer: BC Managed Care – PPO | Source: Ambulatory Visit | Attending: Gynecology | Admitting: Gynecology

## 2013-10-14 DIAGNOSIS — R1900 Intra-abdominal and pelvic swelling, mass and lump, unspecified site: Secondary | ICD-10-CM | POA: Insufficient documentation

## 2013-10-14 DIAGNOSIS — R978 Other abnormal tumor markers: Secondary | ICD-10-CM

## 2013-10-14 DIAGNOSIS — F17200 Nicotine dependence, unspecified, uncomplicated: Secondary | ICD-10-CM | POA: Insufficient documentation

## 2013-10-20 ENCOUNTER — Encounter (HOSPITAL_COMMUNITY): Admission: RE | Payer: Self-pay | Source: Ambulatory Visit

## 2013-10-20 ENCOUNTER — Encounter: Payer: Self-pay | Admitting: Gynecologic Oncology

## 2013-10-20 ENCOUNTER — Ambulatory Visit: Payer: BC Managed Care – PPO | Attending: Gynecologic Oncology | Admitting: Gynecologic Oncology

## 2013-10-20 ENCOUNTER — Inpatient Hospital Stay (HOSPITAL_COMMUNITY): Admission: RE | Admit: 2013-10-20 | Payer: BC Managed Care – PPO | Source: Ambulatory Visit | Admitting: Gynecology

## 2013-10-20 VITALS — BP 142/75 | HR 95 | Temp 97.0°F | Resp 16 | Ht 66.06 in | Wt 169.8 lb

## 2013-10-20 DIAGNOSIS — N9489 Other specified conditions associated with female genital organs and menstrual cycle: Secondary | ICD-10-CM | POA: Insufficient documentation

## 2013-10-20 DIAGNOSIS — D259 Leiomyoma of uterus, unspecified: Secondary | ICD-10-CM | POA: Insufficient documentation

## 2013-10-20 DIAGNOSIS — R971 Elevated cancer antigen 125 [CA 125]: Secondary | ICD-10-CM | POA: Insufficient documentation

## 2013-10-20 DIAGNOSIS — Z801 Family history of malignant neoplasm of trachea, bronchus and lung: Secondary | ICD-10-CM | POA: Insufficient documentation

## 2013-10-20 DIAGNOSIS — F17200 Nicotine dependence, unspecified, uncomplicated: Secondary | ICD-10-CM | POA: Insufficient documentation

## 2013-10-20 DIAGNOSIS — N83209 Unspecified ovarian cyst, unspecified side: Secondary | ICD-10-CM | POA: Insufficient documentation

## 2013-10-20 DIAGNOSIS — Z800 Family history of malignant neoplasm of digestive organs: Secondary | ICD-10-CM | POA: Insufficient documentation

## 2013-10-20 DIAGNOSIS — R1900 Intra-abdominal and pelvic swelling, mass and lump, unspecified site: Secondary | ICD-10-CM

## 2013-10-20 SURGERY — HYSTERECTOMY, ABDOMINAL
Anesthesia: General | Laterality: Bilateral

## 2013-10-20 NOTE — Progress Notes (Signed)
Consult Note: Gyn-Onc  Latoya Ryan 53 y.o. female  CC:  Chief Complaint  Patient presents with  . Pelvic Mass    New consult    HPI: Patient is seen today in consultation at the request of Dr. Toney Rakes.  Patient is a very pleasant 53 year old gravida 0 who is seen in November of 2014 for an ultrasound as a result of incidental finding during routine GYN examination 07/12/2013. The patient had not seen a gynecologist in over 7 years. Ultrasound 08/06/2013 revealed the uterus that was enlarged measuring 9.4 x 7 x 4.6 cm with an endometrial stripe of 8.3. The patient had 5 fibroids with the largest being approximately 4 cm. On the right she had a thickwalled avascular cyst measuring 2.3 x 1.8 x 1.7 cm consistent with a corpus luteum cyst. There was a large left ovarian mass measuring 6.2 x 4.1 cm. She received Depo-Provera to see if that would improve the ovarian masses and she underwent ultrasound on 10/04/2013. It reveals the uterus to be 9.5 cm in length. The endometrial thickness is 1.3 cm. The right ovary measured 3.5 x 2.4 x 2.2 cm. The left ovary measured 8.8 x 8.8 x 6.7 cm. The findings were of an anteverted fibroid uterus. There is a prominent endometrial cavity. The right ovary was normal with follicles. There was a serpentine tubular mass with low level  that was avascular measuring approximately 16.8 x 5.2 x 5.5 cm. There was a left ovarian mass measuring 8.8 x 10.0 x 6.9 cm with diffuse homogeneous echoes. Pap smear was negative. CA 125 October 04, 2013 was 58.4. Her ROMA was elevated at 2.26 with the upper limit of normal being 1.31.  She had been scheduled for surgery with Dr. Toney Rakes however when these lab values return she was referred to [place].  She states that she is otherwise asymptomatic. She was having regular monthly cycles with her last cycle being December 11. However with the depo-provera, she's had some dark vaginal spotting which is new for her. She had night sweats for a  few years. She's never had any pain or discomfort. She denies any dyspareunia. She's not had a mammogram in approximately 8 years.  She'll smoke about half a pack to pack per day. She does not drink any alcohol. 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 She is an 62-year-old daughter.  Her father died of lung cancer age of 66 was a smoker. His maternal grandmother died of colon cancer. Her mother currently has leukemia.  Review of Systems  Constitutional: Denies fever. Skin: No rash, sores, jaundice, itching, or dryness.  Cardiovascular: No chest pain, shortness of breath, or edema  Pulmonary: No cough or wheeze.  Gastro Intestinal:  No nausea, vomiting, constipation, or diarrhea reported. No bright red blood per rectum or change in bowel movement.  Genitourinary: No frequency, urgency, or dysuria.  Denies vaginal bleeding and discharge except as above.  Musculoskeletal: No myalgia, arthralgia, joint swelling or pain.  Neurologic: No weakness, numbness, or change in gait.  Psychology: No changes  Current Meds:  Outpatient Encounter Prescriptions as of 10/20/2013  Medication Sig  . Cholecalciferol (VITAMIN D) 400 UNIT/ML LIQD Take 5,000 Units by mouth daily.  . naproxen sodium (ANAPROX) 220 MG tablet Take 220 mg by mouth daily as needed (for headache).  . OVER THE COUNTER MEDICATION Take 1 scoop by mouth daily. Wildberry vitamin supplement. Added to a glass of juice.  Marland Kitchen OVER THE COUNTER  MEDICATION Place 5-6 drops under the tongue 2 (two) times a week. Trace elements oral drops    Allergy: No Known Allergies  Social Hx:   History   Social History  . Marital Status: Married    Spouse Name: N/A    Number of Children: N/A  . Years of Education: N/A   Occupational History  . Not on file.   Social History Main Topics  . Smoking status: Current Every Day Smoker -- 1.00 packs/day    Types: Cigarettes  . Smokeless tobacco: Never Used  . Alcohol Use: No  .  Drug Use: Not on file  . Sexual Activity: Yes   Other Topics Concern  . Not on file   Social History Narrative  . No narrative on file    Past Surgical Hx: History reviewed. No pertinent past surgical history.  Past Medical Hx: History reviewed. No pertinent past medical history.  Oncology Hx:   No history exists.    Family Hx:  Family History  Problem Relation Age of Onset  . Diabetes Mother   . Cancer Father     LUNG- SMOKER  . Cancer Maternal Grandmother     COLON  . Diabetes Maternal Grandfather     Vitals:  Blood pressure 142/75, pulse 95, temperature 97 F (36.1 C), resp. rate 16, height 5' 6.06" (1.678 m), weight 169 lb 12.8 oz (77.021 kg), last menstrual period 08/26/2013.  Physical Exam: Well-nourished well-developed female in no acute distress.  Neck: Supple, no lymphadenopathy no thyromegaly.  Lungs: Clear to auscultation bilaterally.  Cardiovascular: Regular rate and rhythm.  Abdomen: Soft, nontender, nondistended. There are no palpable mass or prostatomegaly.  Groins: No lymphadenopathy.  Extremities: No edema.  Pelvic. Normal external female genitalia. The cervix is without lesions. There's been [employer] discharge consistent with menstrual flow. Bimanual examination reveals a conglomerate approximately 16 cm abdominal pelvic mass. Feels more prominent on the right than on the left. The mass moves with the ovary and the uterus. There is no nodularity. She is nontender.  Assessment/Plan: 53 year old bilateral adnexal masses. Her CA 125 is elevated as is her ROMA score.  I met with her and her mother today. I discussed with them that while these tumor markers are elevated I still believe this is most likely a benign condition. However, we will proceed with definitive surgery and the mass will be sent for frozen section evaluation. Similarly, we will evaluate the endometrium and the uterus with frozen section at the time of her surgery. She was offered several  dates here in [place] however the earliest dates are next week in [place] and she wants to have the surgery soon as possible. She scheduled for a TAH[facility]BSO at [facility] on February 12. She has a preoperative at [facility] this coming Friday at 1:00. The risks and benefits of surgery with were discussed with the patient and she wishes to proceed. She understands that she will sign an informed consent and be seen by anesthesiology on Friday.  Her questions as well as those of her mom were elicited in answer to their satisfaction. They are very pleased that her surgery can be done next week.   Tyquisha Sharps A., MD 10/20/2013, 1:50 PM

## 2013-10-20 NOTE — Patient Instructions (Signed)
Plan for surgery at [facility] on Feb. 12 with Dr. Alycia Rossetti.  Your pre-operative visit will be on Friday, Feb. 6 at 1:00pm at [facility].    Hysterectomy Information A hysterectomy is a surgery to remove your uterus. After surgery, you will no longer have periods. Also, you will not be able to get pregnant.  REASONS FOR THIS SURGERY  You have bleeding that is not normal and keeps coming back.  You have lasting (chronic) lower belly (pelvic) pain.  You have a lasting infection.  The lining of your uterus grows outside your uterus.  The lining of your uterus grows in the muscle of your uterus.  Your uterus falls down into your vagina.  You have a growth in your uterus that causes problems.  You have cells that could turn into cancer (precancerous cells).  You have cancer of the uterus or cervix. TYPES  There are 3 types of hysterectomies. Depending on the type, the surgery will:  Remove the top part of the uterus only.  Remove the uterus and the cervix.  Remove the uterus, cervix, and tissue that holds the uterus in place in the lower belly. WAYS A HYSTERECTOMY CAN BE PERFORMED There are 5 ways this surgery can be performed.   A cut (incision) is made in the belly (abdomen). The uterus is taken out through the cut.  A cut is made in the vagina. The uterus is taken out through the cut.  Three or four cuts are made in the belly. A surgical device with a camera is put through one of the cuts. The uterus is cut into small pieces. The uterus is taken out through the cuts or the vagina.  Three or four cuts are made in the belly. A surgical device with a camera is put through one of the cuts. The uterus is taken out through the vagina.  Three or four cuts are made in the belly. A surgical device that is controlled by a computer makes a visual image. The device helps the surgeon control the surgical tools. The uterus is cut into small pieces. The pieces are taken out through the cuts or through the  vagina. WHAT TO EXPECT AFTER THE SURGERY  You will be given pain medicine.  You will need help at home for 3 5 days after surgery.  You will need to see your doctor in 2 4 weeks after surgery.  You may get hot flashes, have night sweats, and have trouble sleeping.  You may need to have Pap tests in the future if your surgery was related to cancer. Talk to your doctor. It is still good to have regular exams. Document Released: 11/25/2011 Document Revised: 06/23/2013 Document Reviewed: 05/10/2013 [facility] Patient Information 2014 [place].

## 2014-12-17 IMAGING — CR DG CHEST 2V
2 series · 2 of 2 positions shown · non-contrast
Comparison: None.

CLINICAL DATA: Smoker.

EXAM:
CHEST  2 VIEW

[view not recorded (1 of 2)]
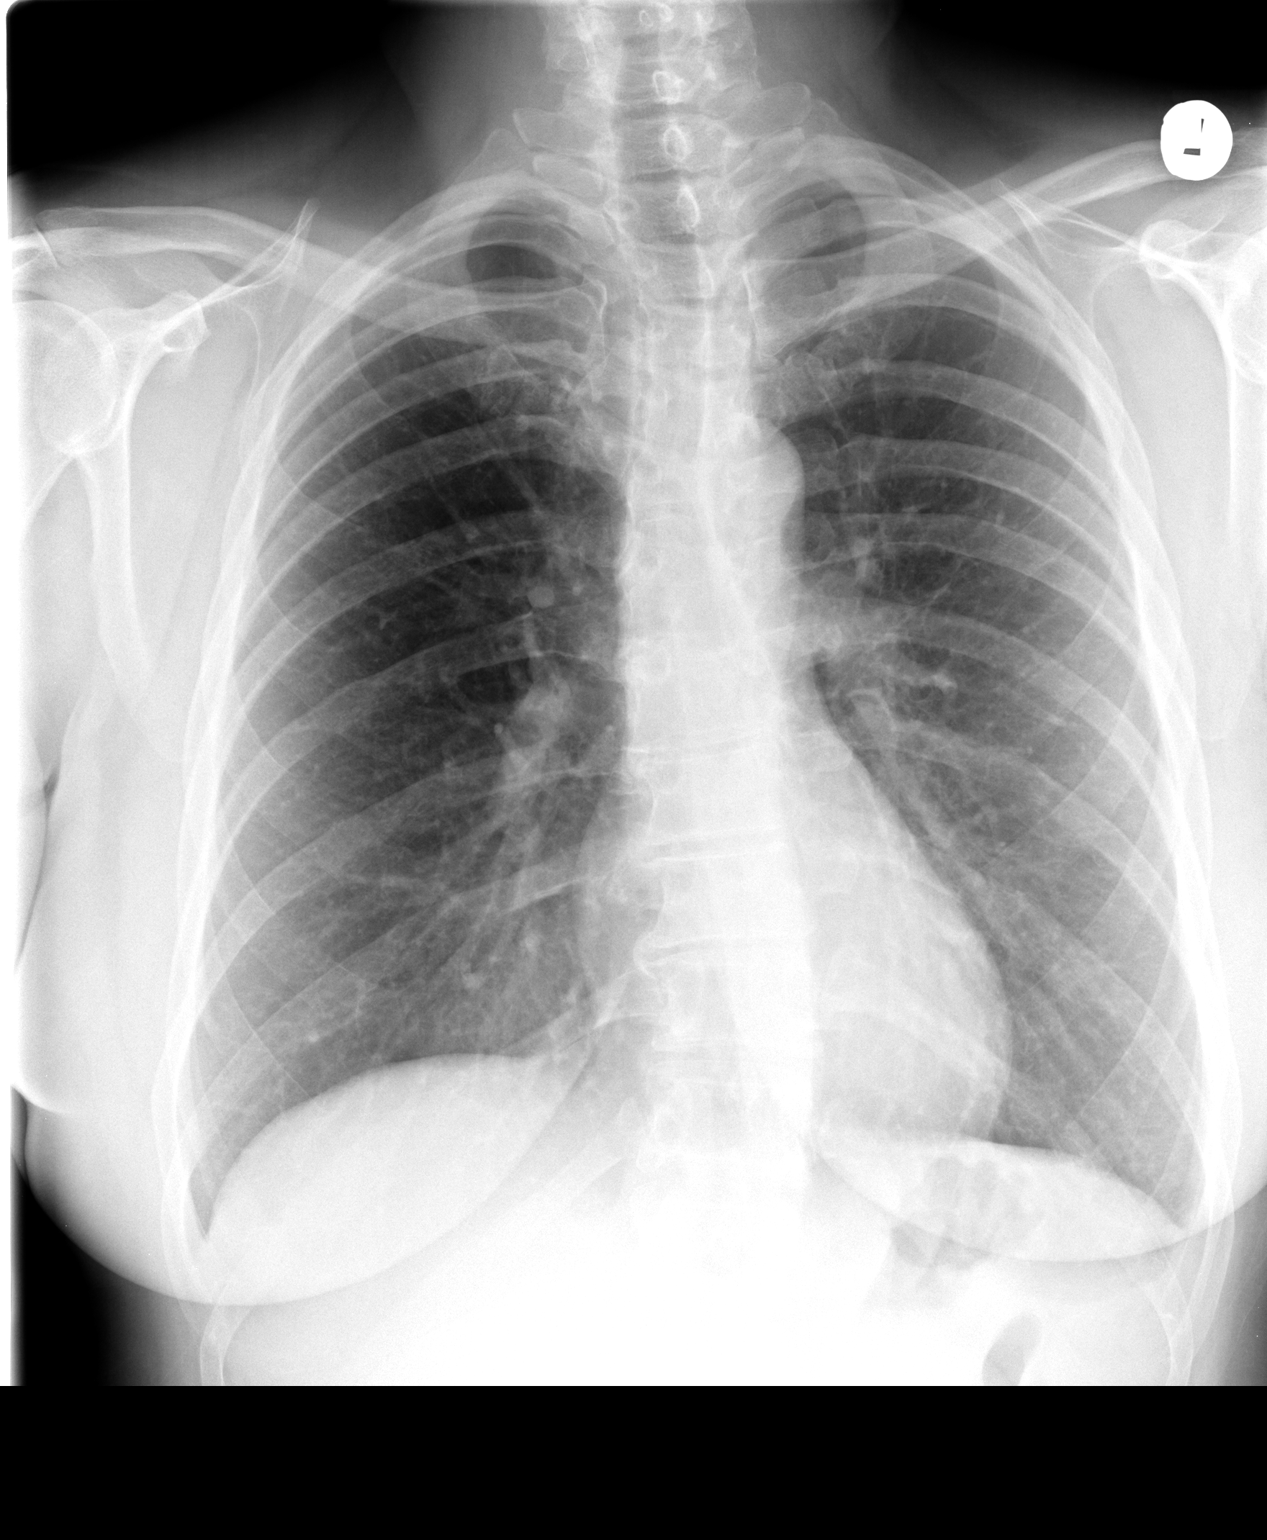

[view not recorded (2 of 2)]
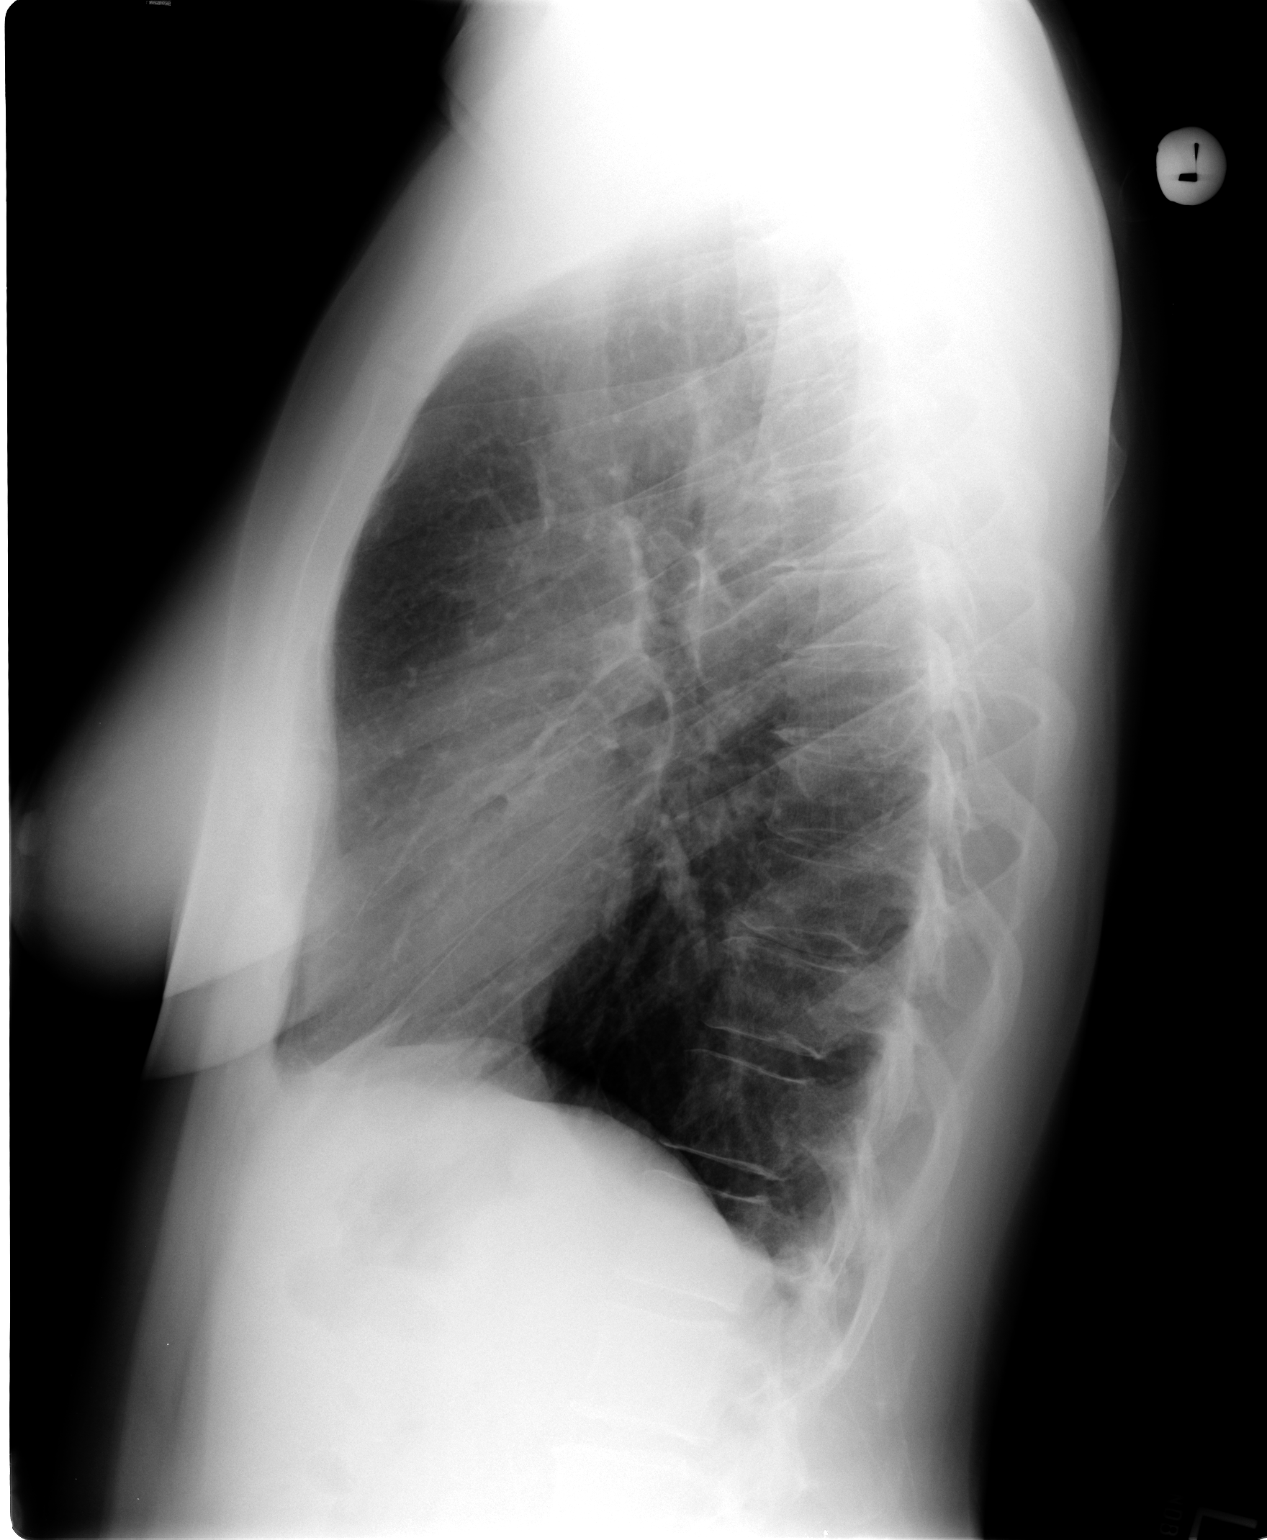

[2 of 2 positions shown; findings below may reference images not displayed]

FINDINGS: The heart size and mediastinal contours are within normal limits.
Both lungs are clear. The visualized skeletal structures are
unremarkable.
IMPRESSION: No active cardiopulmonary disease.

## 2016-08-02 ENCOUNTER — Telehealth: Payer: Self-pay | Admitting: Family Medicine
